# Patient Record
Sex: Female | Born: 1995 | Race: Black or African American | Hispanic: No | Marital: Single | Smoking: Never smoker
Health system: Southern US, Community
[De-identification: ages and names within clinical notes are randomized; demographics above are authoritative.]

---

## 2021-01-05 ENCOUNTER — Emergency Department (HOSPITAL_COMMUNITY)
Admission: EM | Admit: 2021-01-05 | Discharge: 2021-01-05 | Disposition: A | Discharge status: Home / Self Care | Payer: Self-pay | Attending: Emergency Medicine | Admitting: Emergency Medicine

## 2021-01-05 ENCOUNTER — Encounter (HOSPITAL_COMMUNITY): Payer: Self-pay | Admitting: Emergency Medicine

## 2021-01-05 ENCOUNTER — Other Ambulatory Visit: Payer: Self-pay

## 2021-01-05 ENCOUNTER — Emergency Department (HOSPITAL_COMMUNITY): Payer: Self-pay

## 2021-01-05 DIAGNOSIS — S93401A Sprain of unspecified ligament of right ankle, initial encounter: Secondary | ICD-10-CM | POA: Insufficient documentation

## 2021-01-05 DIAGNOSIS — X501XXA Overexertion from prolonged static or awkward postures, initial encounter: Secondary | ICD-10-CM | POA: Insufficient documentation
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
## 2021-01-05 NOTE — Discharge Instructions (Addendum)
At this time there does not appear to be the presence of an emergent medical condition, however there is always the potential for conditions to change. Please read and follow the below instructions.  Please return to the Emergency Department immediately for any new or worsening symptoms. Please be sure to follow up with your Primary Care Provider within one week regarding your visit today; please call their office to schedule an appointment even if you are feeling better for a follow-up visit. Please call the orthopedic specialist Dr. Yevette Edwards on your discharge paperwork for reevaluation of your ankle pain. Please use rest ice and elevation to help with your pain and swelling. Please use the crutches and ankle brace given to you today to protect your ankle from further injury.  Go to the nearest Emergency Department immediately if: You have fever or chills You cannot feel your toes or foot. Your foot or toes look blue. You have very bad pain that gets worse. You have any new/concerning or worsening of symptoms.   Please read the additional information packets attached to your discharge summary.  Do not take your medicine if  develop an itchy rash, swelling in your mouth or lips, or difficulty breathing; call 911 and seek immediate emergency medical attention if this occurs.  You may review your lab tests and imaging results in their entirety on your MyChart account.  Please discuss all results of fully with your primary care provider and other specialist at your follow-up visit.  Note: Portions of this text may have been transcribed using voice recognition software. Every effort was made to ensure accuracy; however, inadvertent computerized transcription errors may still be present.

## 2021-01-05 NOTE — ED Notes (Signed)
Ortho tech notified of orders. 
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
## 2021-01-06 NOTE — Progress Notes (Signed)
Orthopedic Tech Progress Note Patient Details:  Tanya Murphy [birth year] [id number]  Ortho Devices Type of Ortho Device: Crutches,Ankle Air splint Ortho Device/Splint Location: rle Ortho Device/Splint Interventions: Ordered,Application,Adjustment   Post Interventions Patient Tolerated: Well Instructions Provided: Care of device,Adjustment of device   [name] Post 01/06/2021, 12:07 AM

## 2021-05-04 ENCOUNTER — Other Ambulatory Visit: Payer: Self-pay

## 2021-05-04 ENCOUNTER — Emergency Department (HOSPITAL_BASED_OUTPATIENT_CLINIC_OR_DEPARTMENT_OTHER)
Admission: EM | Admit: 2021-05-04 | Discharge: 2021-05-05 | Disposition: A | Discharge status: Home / Self Care | Payer: Self-pay | Attending: Emergency Medicine | Admitting: Emergency Medicine

## 2021-05-04 ENCOUNTER — Encounter (HOSPITAL_BASED_OUTPATIENT_CLINIC_OR_DEPARTMENT_OTHER): Payer: Self-pay | Admitting: *Deleted

## 2021-05-04 DIAGNOSIS — T7421XA Adult sexual abuse, confirmed, initial encounter: Secondary | ICD-10-CM | POA: Insufficient documentation

## 2021-05-04 NOTE — ED Notes (Signed)
Dr. Molpus in with pt.  °
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
## 2021-05-04 NOTE — ED Notes (Signed)
Called SANE (Donis Kotowski) at 1056pm

## 2021-05-05 LAB — POC URINE PREG, ED: Preg Test, Ur: NEGATIVE

## 2021-05-05 MED ORDER — METRONIDAZOLE 500 MG PO TABS
2000.0000 mg | ORAL_TABLET | Freq: Once | ORAL | Status: AC
  Administered 2021-05-05: 03:00:00 2000 mg via ORAL
  Filled 2021-05-05: qty 4

## 2021-05-05 MED ORDER — ULIPRISTAL ACETATE 30 MG PO TABS
30.0000 mg | ORAL_TABLET | Freq: Once | ORAL | Status: AC
  Administered 2021-05-05: 03:00:00 30 mg via ORAL
  Filled 2021-05-05: qty 1

## 2021-05-05 MED ORDER — LIDOCAINE HCL (PF) 1 % IJ SOLN
1.0000 mL | Freq: Once | INTRAMUSCULAR | Status: AC
  Administered 2021-05-05: 03:00:00 1 mL
  Filled 2021-05-05: qty 5

## 2021-05-05 MED ORDER — CEFTRIAXONE SODIUM 500 MG IJ SOLR
500.0000 mg | Freq: Once | INTRAMUSCULAR | Status: AC
  Administered 2021-05-05: 03:00:00 500 mg via INTRAMUSCULAR
  Filled 2021-05-05: qty 500

## 2021-05-05 MED ORDER — AZITHROMYCIN 250 MG PO TABS
1000.0000 mg | ORAL_TABLET | Freq: Once | ORAL | Status: AC
  Administered 2021-05-05: 03:00:00 1000 mg via ORAL
  Filled 2021-05-05: qty 4

## 2021-05-05 NOTE — SANE Note (Signed)
The SANE/FNE (Forensic Nurse Examiner) consult has been completed. The primary RN and/or provider have been notified. Please contact the SANE/FNE nurse on call (listed in Amion) with any further concerns.  

## 2021-05-05 NOTE — SANE Note (Signed)
Pregnancy Test  Catalog number [id number] GTIN: [id number] LOT: [id number] EXP:2021-08-16  Result: NEGATIVE

## 2021-05-05 NOTE — ED Notes (Signed)
SANE RN has arrived for patient assessment.
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
